# Patient Record
Sex: Male | Born: 1995 | Race: White | Hispanic: No | Marital: Single | Smoking: Never smoker
Health system: Southern US, Community
[De-identification: ages and names within clinical notes are randomized; demographics above are authoritative.]

## PROBLEM LIST (undated history)

## (undated) DIAGNOSIS — R519 Headache, unspecified: Secondary | ICD-10-CM

## (undated) DIAGNOSIS — R51 Headache: Secondary | ICD-10-CM

## (undated) HISTORY — PX: TYMPANOSTOMY TUBE PLACEMENT: SHX32

## (undated) HISTORY — DX: Headache, unspecified: R51.9

## (undated) HISTORY — DX: Headache: R51

---
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
## 2016-01-02 ENCOUNTER — Encounter: Payer: Self-pay | Admitting: Neurology

## 2016-01-02 ENCOUNTER — Ambulatory Visit (INDEPENDENT_AMBULATORY_CARE_PROVIDER_SITE_OTHER): Payer: BLUE CROSS/BLUE SHIELD | Admitting: Neurology

## 2016-01-02 VITALS — BP 116/66 | HR 52 | Ht 69.0 in | Wt 159.6 lb

## 2016-01-02 DIAGNOSIS — H538 Other visual disturbances: Secondary | ICD-10-CM | POA: Diagnosis not present

## 2016-01-02 DIAGNOSIS — R51 Headache: Secondary | ICD-10-CM | POA: Diagnosis not present

## 2016-01-02 DIAGNOSIS — H539 Unspecified visual disturbance: Secondary | ICD-10-CM | POA: Diagnosis not present

## 2016-01-02 DIAGNOSIS — G43009 Migraine without aura, not intractable, without status migrainosus: Secondary | ICD-10-CM

## 2016-01-02 DIAGNOSIS — R519 Headache, unspecified: Secondary | ICD-10-CM | POA: Insufficient documentation

## 2016-01-02 MED ORDER — NORTRIPTYLINE HCL 10 MG PO CAPS: 20.0000 mg | ORAL_CAPSULE | Freq: Every day | ORAL | Status: AC

## 2016-01-02 NOTE — Progress Notes (Signed)
[facility]    Provider:  Dr Lucia GaskinsAhern Referring Provider: Alena BillsLittle, Edgar, MD Primary Care Physician:  Thurston PoundsEd Little, MD  CC:  Headaches  HPI:  Jeremy Patel is a 20 y.o. male here as a referral from Dr. Clarene DukeLittle for headaches. Mother is here with patient and provides information. Started 4-6 weeks ago in the setting of school and work and less sleep. Never had headaches before like this. He has been fatigued and muscle pain. History of concussion in 2013 which resolved. Mother with migraines. He was having headaches every day. Dull ache. Primarily on the right around the top of the head. Feels like pressure. Some pounding but mostly dull. No light sensitivity, sound sensiticity, nausea or vomiting. In the last 30 days he has had 20 headache days. 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 Headaches occur mid day to later at night. They can be a 5-6/10 in pain. They last 30 minutes to an hour and a half. Ibuprofen helps, a nap helps. He is wearing his sunglasses more, he does have sun sensitivity. He goes to bed around 11pm and sleeps for 8 hours. He has some blurry vision. He also have blurry vision with the headaches. No other focal neurologic or other deficits. No numbness, no dizziness. No Hx of heart disease or cardiac symptoms.   Reviewed notes, labs and imaging from outside physicians, which showed: TSH, cmp, cbc, nml.   CT head 02/2012 showed No acute intracranial abnormalities including mass lesion or mass effect, hydrocephalus, extra-axial fluid collection, midline shift, hemorrhage, or acute infarction, large ischemic events (personally reviewed images)     Review of Systems: Patient complains of symptoms per HPI as well as the following symptoms: No CP, no SOB. Pertinent negatives per HPI. All others negative.   Social History   Social History  . Marital Status: Single   Spouse Name: N/A  . Number of Children: 0  . Years of Education: 12+   Occupational History  . [employer]    Social History Main Topics  . Smoking status: Never Smoker   . Smokeless tobacco: Not on file  . Alcohol Use: No  . Drug Use: No  . Sexual Activity: Not on file   Other Topics Concern  . Not on file   Social History Narrative   Lives with father   Caffeine use: Coffee/tea/soda daily    Family History  Problem Relation Age of Onset  . Migraines Mother     Past Medical History  Diagnosis Date  . Headache     Past Surgical History  Procedure Laterality Date  . Tympanostomy tube placement      x2    Current Outpatient Prescriptions  Medication Sig Dispense Refill  . ibuprofen (ADVIL,MOTRIN) 200 MG tablet Take 200 mg by mouth every 6 (six) hours as needed.    . nortriptyline (PAMELOR) 10 MG capsule Take 2 capsules (20 mg total) by mouth at bedtime. 60 capsule 12   No current facility-administered medications for this visit.    Allergies as of 01/02/2016  . (No Known Allergies)    Vitals: BP 116/66 mmHg  Pulse 52  Ht 5\' 9"  (1.753 m)  Wt 159 lb 9.6 oz (72.394 kg)  BMI 23.56 kg/m2 Last Weight:  Wt Readings from Last 1 Encounters:  01/02/16 159 lb 9.6 oz (72.394 kg) (62 %*, Z = 0.31)   *  Growth percentiles are based on CDC 2-20 Years data.   Last Height:   Ht Readings from Last 1 Encounters:  01/02/16 5\' 9"  (1.753 m) (43 %*, Z = -0.18)   * Growth percentiles are based on CDC 2-20 Years data.    Physical exam: Exam: Gen: NAD, conversant, well nourised, , well groomed                     CV: RRR, no MRG. No Carotid Bruits. No peripheral edema, warm, nontender Eyes: Conjunctivae clear without exudates or hemorrhage  Neuro: Detailed Neurologic Exam  Speech:    Speech is normal; fluent and spontaneous with normal comprehension.  Cognition:    The patient is oriented to person, place, and time;     recent and remote memory intact;      language fluent;     normal attention, concentration,     fund of knowledge Cranial Nerves:    The pupils are equal, round, and reactive to light. The fundi are normal and spontaneous venous pulsations are present. Visual fields are full to finger confrontation. Extraocular movements are intact. Trigeminal sensation is intact and the muscles of mastication are normal. The face is symmetric. The palate elevates in the midline. Hearing intact. Voice is normal. Shoulder shrug is normal. The tongue has normal motion without fasciculations.   Coordination:    Normal finger to nose and heel to shin. Normal rapid alternating movements.   Gait:    Heel-toe and tandem gait are normal.   Motor Observation:    No asymmetry, no atrophy, and no involuntary movements noted. Tone:    Normal muscle tone.    Posture:    Posture is normal. normal erect    Strength:    Strength is V/V in the upper and lower limbs.      Sensation: intact to LT     Reflex Exam:  DTR's:    Deep tendon reflexes in the upper and lower extremities are normal bilaterally.   Toes:    The toes are downgoing bilaterally.   Clonus:    Clonus is absent.      Assessment/Plan:  20 year old male with new onset headaches. Tension type with some minimal migrainous qualities however could be the start of a migraine disorder given FHx.    As far as your medications are concerned, I would like to suggest: Nortriptyline 10mg  at bedtime. In 1 week increase to 20mg .  As far as diagnostic testing: MRI of the brain  Discussed side effects: Serious side effects can include hypotension, hypertension, syncope, ventricular arrhythmias, QT prolongation and other cardiac side effects, stroke and seizures, ataxia tardive dyskinesias, extrapyramidal symptoms, increased intraocular pressure, leukopenia, thrombocytopenia, hallucinations, suicidality and other serious side effects. Common reactions include drowsiness, dry mouth, dizziness,  constipation, blurred vision, palpitations, tachycardia, impaired coordination, increased appetite, nausea vomiting, weakness, confusion, disorientation, restlessness, anxiety and other side effects.  To prevent or relieve headaches, try the following: Cool Compress. Lie down and place a cool compress on your head.  Avoid headache triggers. If certain foods or odors seem to have triggered your migraines in the past, avoid them. A headache diary might help you identify triggers.  Include physical activity in your daily routine. Try a daily walk or other moderate aerobic exercise.  Manage stress. Find healthy ways to cope with the stressors, such as delegating tasks on your to-do list.  Practice relaxation techniques. Try deep breathing, yoga, massage and visualization.  Eat  regularly. Eating regularly scheduled meals and maintaining a healthy diet might help prevent headaches. Also, drink plenty of fluids.  Follow a regular sleep schedule. Sleep deprivation might contribute to headaches Consider biofeedback. With this mind-body technique, you learn to control certain bodily functions - such as muscle tension, heart rate and blood pressure - to prevent headaches or reduce headache pain.    Proceed to emergency room if you experience new or worsening symptoms or symptoms do not resolve, if you have new neurologic symptoms or if headache is severe, or for any concerning symptom.        Naomie Dean, MD  [facility] [address] [place], [place] [address]  Phone [phone] Fax [phone]

## 2016-01-02 NOTE — Patient Instructions (Addendum)
Remember to drink plenty of fluid, eat healthy meals and do not skip any meals. Try to eat protein with a every meal and eat a healthy snack such as fruit or nuts in between meals. Try to keep a regular sleep-wake schedule and try to exercise daily, particularly in the form of walking, 20-30 minutes a day, if you can.   As far as your medications are concerned, I would like to suggest: Nortriptyline 10mg  at bedtime. In 1 week increase to 20mg .  As far as diagnostic testing: MRI of the brain   Please also call us for any test results so we can go over those with you on the phone.  My clinical assistant and will answer any of your questions and relay your messages to me and also relay most of my messages to you.   Our phone number is [phone]. We also have an after hours call service for urgent matters and there is a physician on-call for urgent questions. For any emergencies you know to call 911 or go to the nearest emergency room

## 2016-01-03 ENCOUNTER — Encounter: Payer: Self-pay | Admitting: Neurology

## 2016-01-03 DIAGNOSIS — G43909 Migraine, unspecified, not intractable, without status migrainosus: Secondary | ICD-10-CM | POA: Insufficient documentation

## 2016-01-04 ENCOUNTER — Telehealth: Payer: Self-pay | Admitting: Neurology

## 2016-01-04 NOTE — Telephone Encounter (Signed)
Pt called to get MRI scheduled.

## 2016-01-05 NOTE — Telephone Encounter (Signed)
Returned patients call with no answer.

## 2016-01-10 ENCOUNTER — Telehealth: Payer: Self-pay | Admitting: Neurology

## 2016-01-10 NOTE — Telephone Encounter (Addendum)
Faxed signed order by Dr Lucia GaskinsAhern to requested fax number. Received fax failed.  Called [facility] to receive correct fax number.  Alt fax : [phone]. Re-faxed to this number. Received fax failed as well.   Tried [phone] again. Fax failed. This is attempt #3. Spoke to LauraShinekwa. She stated that Cephus Shellinganielle W who originally sent order can call and f/u. I advised she already left today, I will send her the message. She verbalized understanding.

## 2016-01-10 NOTE — Telephone Encounter (Signed)
Whit/[facility] [phone] called to advise, Dr. Lucia GaskinsAhern needs to sign order for MRI of brain, with and without contrast and fax back to [phone].

## 2016-01-11 NOTE — Telephone Encounter (Signed)
Chris/[facility] [phone] called, rec'd the fax but the home was not working he said. Operator gave mobile number. Thayer OhmChris said he would call back to speak with Danielle at a later date. FYI

## 2016-01-19 DIAGNOSIS — R51 Headache: Secondary | ICD-10-CM | POA: Diagnosis not present

## 2016-01-24 ENCOUNTER — Ambulatory Visit (INDEPENDENT_AMBULATORY_CARE_PROVIDER_SITE_OTHER): Payer: Self-pay

## 2016-01-24 DIAGNOSIS — H538 Other visual disturbances: Secondary | ICD-10-CM

## 2016-01-24 DIAGNOSIS — Z0289 Encounter for other administrative examinations: Secondary | ICD-10-CM

## 2016-01-24 DIAGNOSIS — H539 Unspecified visual disturbance: Secondary | ICD-10-CM

## 2016-01-24 DIAGNOSIS — R51 Headache: Principal | ICD-10-CM

## 2016-01-24 DIAGNOSIS — R519 Headache, unspecified: Secondary | ICD-10-CM

## 2016-01-25 ENCOUNTER — Telehealth: Payer: Self-pay | Admitting: Neurology

## 2016-01-25 NOTE — Telephone Encounter (Signed)
Dr Lucia GaskinsAhern- Lorain ChildesFYI. Pt calling about results.  Called pt back. Advised results not ready yet. He stated he went for MRI on 01/19/16. Advised we will call him once ready. He verbalized understanding.

## 2016-01-25 NOTE — Telephone Encounter (Signed)
Pt called in for MRI results. Please call and advise  °

## 2016-01-26 NOTE — Telephone Encounter (Signed)
MRi was normal thanks

## 2016-01-27 NOTE — Telephone Encounter (Signed)
Called and was not able to LM due to mailbox full.

## 2016-01-30 ENCOUNTER — Telehealth: Payer: Self-pay | Admitting: *Deleted

## 2016-01-30 NOTE — Telephone Encounter (Signed)
I have spoken with Jeremy Patel this morning and per AA, advised that mri is normal.  He verbalized understanding of same/fim

## 2016-01-30 NOTE — Telephone Encounter (Signed)
Per Dr Lucia GaskinsAhern, spoke with patient and informed him his MRI brain results are normal. He verbalized understanding, appreciation.

## 2016-04-10 ENCOUNTER — Telehealth: Payer: Self-pay | Admitting: *Deleted

## 2016-04-10 ENCOUNTER — Ambulatory Visit: Payer: BLUE CROSS/BLUE SHIELD | Admitting: Neurology

## 2016-04-10 NOTE — Telephone Encounter (Signed)
no showed f/u 

## 2016-04-13 ENCOUNTER — Encounter: Payer: Self-pay | Admitting: Neurology

## 2020-04-13 ENCOUNTER — Ambulatory Visit
Admission: EM | Admit: 2020-04-13 | Discharge: 2020-04-13 | Disposition: A | Discharge status: Home / Self Care | Payer: BC Managed Care – PPO | Attending: Emergency Medicine | Admitting: Emergency Medicine

## 2020-04-13 ENCOUNTER — Other Ambulatory Visit: Payer: Self-pay

## 2020-04-13 DIAGNOSIS — R059 Cough, unspecified: Secondary | ICD-10-CM

## 2020-04-13 DIAGNOSIS — R05 Cough: Secondary | ICD-10-CM

## 2020-04-13 DIAGNOSIS — T881XXA Other complications following immunization, not elsewhere classified, initial encounter: Secondary | ICD-10-CM

## 2020-04-13 DIAGNOSIS — R6889 Other general symptoms and signs: Secondary | ICD-10-CM

## 2020-04-13 MED ORDER — BENZONATATE 100 MG PO CAPS: 100.0000 mg | ORAL_CAPSULE | Freq: Three times a day (TID) | ORAL | 0 refills | Status: AC

## 2020-04-13 NOTE — ED Triage Notes (Signed)
Pt triaged and dc by provider prior to RN

## 2020-04-13 NOTE — ED Provider Notes (Signed)
[facility]   [id number] 04/13/20 Arrival Time: 1859   CC: flu like symptoms  SUBJECTIVE: History from: patient.  Jeremy Patel is a 24 y.o. male who presents with cough and fatigue following covid vaccine 3 days ago.  Had COVID in May and both vaccines.  Denies alleviating or aggravating factors.  Reports previous symptoms in the past with PNA.   Denies SOB, wheezing, chest pain, nausea, changes in bowel or bladder habits.    ROS: As per HPI.  All other pertinent ROS negative.     Past Medical History:  Diagnosis Date  . Headache    Past Surgical History:  Procedure Laterality Date  . TYMPANOSTOMY TUBE PLACEMENT     x2   No Known Allergies No current facility-administered medications on file prior to encounter.   Current Outpatient Medications on File Prior to Encounter  Medication Sig Dispense Refill  . ibuprofen (ADVIL,MOTRIN) 200 MG tablet Take 200 mg by mouth every 6 (six) hours as needed.    . nortriptyline (PAMELOR) 10 MG capsule Take 2 capsules (20 mg total) by mouth at bedtime. 60 capsule 12   Social History   Socioeconomic History  . Marital status: Single    Spouse name: Not on file  . Number of children: 0  . Years of education: 12+  . Highest education level: Not on file  Occupational History  . Occupation: [employer]  Tobacco Use  . Smoking status: Never Smoker  Substance and Sexual Activity  . Alcohol use: No    Alcohol/week: 0.0 standard drinks  . Drug use: No  . Sexual activity: Not on file  Other Topics Concern  . Not on file  Social History Narrative   Lives with father   Caffeine use: Coffee/tea/soda daily   Social Determinants of Health   Financial Resource Strain:   . Difficulty of Paying Living Expenses: Not on file  Food Insecurity:   . Worried About Programme researcher, broadcasting/film/video in the Last Year: Not on file  . Ran Out of Food in the Last Year: Not on file  Transportation Needs:   . Lack of Transportation (Medical):  Not on file  . Lack of Transportation (Non-Medical): Not on file  Physical Activity:   . Days of Exercise per Week: Not on file  . Minutes of Exercise per Session: Not on file  Stress:   . Feeling of Stress : Not on file  Social Connections:   . Frequency of Communication with Friends and Family: Not on file  . Frequency of Social Gatherings with Friends and Family: Not on file  . Attends Religious Services: Not on file  . Active Member of Clubs or Organizations: Not on file  . Attends Banker Meetings: Not on file  . Marital Status: Not on file  Intimate Partner Violence:   . Fear of Current or Ex-Partner: Not on file  . Emotionally Abused: Not on file  . Physically Abused: Not on file  . Sexually Abused: Not on file   Family History  Problem Relation Age of Onset  . Migraines Mother     OBJECTIVE:  Vitals:   04/13/20 1926  BP: (!) 138/99  Pulse: (!) 109  Resp: 16  Temp: 98.1 F (36.7 C)  TempSrc: Tympanic  SpO2: 97%     General appearance: alert; well-appearing, nontoxic; speaking in full sentences and tolerating own secretions HEENT: NCAT; Ears: EACs clear, TMs pearly gray; Eyes: PERRL.  EOM grossly intact. Nose: nares  patent without rhinorrhea, Throat: oropharynx clear, tonsils non erythematous or enlarged, uvula midline  Neck: supple without LAD Lungs: unlabored respirations, symmetrical air entry; cough: absent; no respiratory distress; CTAB Heart: regular rate and rhythm.  Skin: warm and dry Psychological: alert and cooperative; anxious mood and affect   ASSESSMENT & PLAN:  1. Cough   2. Postimmunization reaction, initial encounter   3. Flu-like symptoms     Meds ordered this encounter  Medications  . benzonatate (TESSALON) 100 MG capsule    Sig: Take 1 capsule (100 mg total) by mouth every 8 (eight) hours.    Dispense:  21 capsule    Refill:  0    Order Specific Question:   Supervising Provider    Answer:   Eustace Moore [[id number]]     Symptoms most likely secondary to COVID vaccine Get plenty of rest and push fluids Tessalon Perles prescribed for cough Use OTC zyrtec for nasal congestion, runny nose, and/or sore throat Use OTC flonase for nasal congestion and runny nose Use medications daily for symptom relief Use OTC medications like ibuprofen or tylenol as needed fever or pain Call or go to the ED if you have any new or worsening symptoms such as fever, cough, shortness of breath, chest tightness, chest pain, turning blue, changes in mental status, etc...   Reviewed expectations re: course of current medical issues. Questions answered. Outlined signs and symptoms indicating need for more acute intervention. Patient verbalized understanding. After Visit Summary given.         Rennis Harding, PA-C 04/13/20 1933

## 2020-04-13 NOTE — Discharge Instructions (Signed)
Symptoms most likely secondary to COVID vaccine Get plenty of rest and push fluids Tessalon Perles prescribed for cough Use OTC zyrtec for nasal congestion, runny nose, and/or sore throat Use OTC flonase for nasal congestion and runny nose Use medications daily for symptom relief Use OTC medications like ibuprofen or tylenol as needed fever or pain Call or go to the ED if you have any new or worsening symptoms such as fever, cough, shortness of breath, chest tightness, chest pain, turning blue, changes in mental status, etc..Marland Kitchen
